# Patient Record
Sex: Male | Born: 1985 | Race: Black or African American | Hispanic: No | Marital: Single | Smoking: Current every day smoker
Health system: Southern US, Community
[De-identification: ages and names within clinical notes are randomized; demographics above are authoritative.]

---
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
## 2007-01-13 ENCOUNTER — Emergency Department (HOSPITAL_COMMUNITY): Admission: EM | Admit: 2007-01-13 | Discharge: 2007-01-13 | Payer: Self-pay | Admitting: Emergency Medicine

## 2007-10-30 ENCOUNTER — Emergency Department: Payer: Self-pay | Admitting: Unknown Physician Specialty
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
## 2017-07-22 ENCOUNTER — Encounter: Payer: Self-pay | Admitting: Emergency Medicine

## 2017-07-22 ENCOUNTER — Emergency Department
Admission: EM | Admit: 2017-07-22 | Discharge: 2017-07-22 | Disposition: A | Discharge status: Home / Self Care | Payer: Self-pay | Attending: Emergency Medicine | Admitting: Emergency Medicine

## 2017-07-22 DIAGNOSIS — Y939 Activity, unspecified: Secondary | ICD-10-CM | POA: Insufficient documentation

## 2017-07-22 DIAGNOSIS — F17210 Nicotine dependence, cigarettes, uncomplicated: Secondary | ICD-10-CM | POA: Insufficient documentation

## 2017-07-22 DIAGNOSIS — W57XXXA Bitten or stung by nonvenomous insect and other nonvenomous arthropods, initial encounter: Secondary | ICD-10-CM | POA: Insufficient documentation

## 2017-07-22 DIAGNOSIS — S0086XA Insect bite (nonvenomous) of other part of head, initial encounter: Secondary | ICD-10-CM | POA: Insufficient documentation

## 2017-07-22 DIAGNOSIS — R599 Enlarged lymph nodes, unspecified: Secondary | ICD-10-CM | POA: Insufficient documentation

## 2017-07-22 DIAGNOSIS — Y929 Unspecified place or not applicable: Secondary | ICD-10-CM | POA: Insufficient documentation

## 2017-07-22 DIAGNOSIS — R591 Generalized enlarged lymph nodes: Secondary | ICD-10-CM

## 2017-07-22 DIAGNOSIS — Y998 Other external cause status: Secondary | ICD-10-CM | POA: Insufficient documentation

## 2017-07-22 DIAGNOSIS — T63451A Toxic effect of venom of hornets, accidental (unintentional), initial encounter: Secondary | ICD-10-CM

## 2017-07-22 MED ORDER — METHYLPREDNISOLONE SODIUM SUCC 125 MG IJ SOLR
125.0000 mg | Freq: Once | INTRAMUSCULAR | Status: AC
  Administered 2017-07-22: 125 mg via INTRAMUSCULAR
  Filled 2017-07-22: qty 2

## 2017-07-22 MED ORDER — PREDNISONE 50 MG PO TABS: ORAL_TABLET | ORAL | 0 refills | Status: AC

## 2017-07-22 NOTE — ED Triage Notes (Signed)
Pt states he has a lump under his right ear, denies pain, states he isn't sure where it came from.

## 2017-07-22 NOTE — ED Notes (Signed)
See triage note.. States he was stung couple of days ago   States the sting was to right temporal area  Now has swelling under right ear   No resp distress

## 2017-07-22 NOTE — ED Provider Notes (Signed)
[facility] Emergency Department Provider Note  ____________________________________________  Time seen: Approximately 10:54 AM  I have reviewed the triage vital signs and the nursing notes.   HISTORY  Chief Complaint Insect Bite    HPI Alan Cardenas is a 31 y.o. male that presents to the emergency department for evaluation after a hornet sting 3 days ago. Patient noticed the next day that he has a nontender mass under his ear. He was stung by a hornet above his ear near his hairline. He states that area was initially red and painful but this has improved. He is being treated for epididymitis by the health department and is supposed to follow-up with them this week. No recent illness. He denies fever, chills, shortness of breath, chest pain, nausea, vomiting, abdominal pain.   History reviewed. No pertinent past medical history.  There are no active problems to display for this patient.   History reviewed. No pertinent surgical history.  Prior to Admission medications   Medication Sig Start Date End Date Taking? Authorizing Provider  predniSONE (DELTASONE) 50 MG tablet Take one tablet daily 07/22/17   Enid DerryWagner, Jace Fermin, PA-C    Allergies Patient has no known allergies.  No family history on file.  Social History Social History  Substance Use Topics  . Smoking status: Current Every Day Smoker    Packs/day: 1.00    Types: Cigarettes  . Smokeless tobacco: Never Used  . Alcohol use Yes     Comment: occas.     Review of Systems  Constitutional: No fever/chills Cardiovascular: No chest pain. Respiratory: No SOB. Gastrointestinal: No abdominal pain.  No nausea, no vomiting.  Musculoskeletal: Negative for musculoskeletal pain. Skin: Negative for abrasions, lacerations, ecchymosis. Neurological: Negative for headaches, numbness or tingling   ____________________________________________   PHYSICAL EXAM:  VITAL SIGNS: ED Triage Vitals  Enc  Vitals Group     BP 07/22/17 1019 133/69     Pulse Rate 07/22/17 1019 (!) 105     Resp 07/22/17 1019 20     Temp 07/22/17 1019 98.8 F (37.1 C)     Temp Source 07/22/17 1019 Oral     SpO2 07/22/17 1019 98 %     Weight 07/22/17 1014 180 lb (81.6 kg)     Height 07/22/17 1014 5\' 8"  (1.727 m)     Head Circumference --      Peak Flow --      Pain Score --      Pain Loc --      Pain Edu? --      Excl. in GC? --      Constitutional: Alert and oriented. Well appearing and in no acute distress. Eyes: Conjunctivae are normal. PERRL. EOMI. Head: Atraumatic. ENT:      Ears:      Nose: No congestion/rhinnorhea.      Mouth/Throat: Mucous membranes are moist.  Neck: No stridor. 1/2cm x 1/2 cm non tender mobile mass under right ear. Cardiovascular: Normal rate, regular rhythm.  Good peripheral circulation. Respiratory: Normal respiratory effort without tachypnea or retractions. Lungs CTAB. Good air entry to the bases with no decreased or absent breath sounds. Musculoskeletal: Full range of motion to all extremities. No gross deformities appreciated. Neurologic:  Normal speech and language. No gross focal neurologic deficits are appreciated.  Skin:  Skin is warm, dry and intact. No rash noted in hairline where hornet sting.    ____________________________________________   LABS (all labs ordered are listed, but only abnormal results are  displayed)  Labs Reviewed - No data to display ____________________________________________  EKG   ____________________________________________  RADIOLOGY  No results found.  ____________________________________________    PROCEDURES  Procedure(s) performed:    Procedures    Medications  methylPREDNISolone sodium succinate (SOLU-MEDROL) 125 mg/2 mL injection 125 mg (125 mg Intramuscular Given 07/22/17 1053)     ____________________________________________   INITIAL IMPRESSION / ASSESSMENT AND PLAN / ED COURSE  Pertinent labs &  imaging results that were available during my care of the patient were reviewed by me and considered in my medical decision making (see chart for details).  Review of the [place] CSRS was performed in accordance of the [facility] prior to dispensing any controlled drugs.    Patient presented to the emergency department for evaluation after hornet sting. Mass on his neck feels like a lymph node. He is also being treated at the health department for epididimitis. He was given a shot of Solu-Medrol. Patient will be discharged home with prescriptions for prednisone. He will up with his PCP this week if lymphadenopathy does not improve. Patient is given ED precautions to return to the ED for any worsening or new symptoms.     ____________________________________________  FINAL CLINICAL IMPRESSION(S) / ED DIAGNOSES  Final diagnoses:  Hornet sting, accidental or unintentional, initial encounter  Lymphadenopathy      NEW MEDICATIONS STARTED DURING THIS VISIT:  Discharge Medication List as of 07/22/2017 10:52 AM    START taking these medications   Details  predniSONE (DELTASONE) 50 MG tablet Take one tablet daily, Print            This chart was dictated using voice recognition software/Dragon. Despite best efforts to proofread, errors can occur which can change the meaning. Any change was purely unintentional.    Enid Derry, PA-C 07/22/17 1730    Jene Every, MD 07/25/17 [phone]

## 2018-01-22 ENCOUNTER — Other Ambulatory Visit: Payer: Self-pay

## 2018-01-22 ENCOUNTER — Emergency Department
Admission: EM | Admit: 2018-01-22 | Discharge: 2018-01-22 | Disposition: A | Discharge status: Home / Self Care | Payer: Self-pay | Attending: Emergency Medicine | Admitting: Emergency Medicine

## 2018-01-22 ENCOUNTER — Encounter: Payer: Self-pay | Admitting: Emergency Medicine

## 2018-01-22 DIAGNOSIS — H10023 Other mucopurulent conjunctivitis, bilateral: Secondary | ICD-10-CM | POA: Insufficient documentation

## 2018-01-22 DIAGNOSIS — F17210 Nicotine dependence, cigarettes, uncomplicated: Secondary | ICD-10-CM | POA: Insufficient documentation

## 2018-01-22 DIAGNOSIS — H1033 Unspecified acute conjunctivitis, bilateral: Secondary | ICD-10-CM

## 2018-01-22 MED ORDER — SULFACETAMIDE SODIUM 10 % OP SOLN: 2.0000 [drp] | Freq: Four times a day (QID) | OPHTHALMIC | 0 refills | Status: AC

## 2018-01-22 MED ORDER — CIPROFLOXACIN HCL 0.3 % OP SOLN
2.0000 [drp] | OPHTHALMIC | Status: DC
  Administered 2018-01-22: 2 [drp] via OPHTHALMIC
  Filled 2018-01-22: qty 2.5

## 2018-01-22 NOTE — ED Notes (Addendum)
Presents with both eyes red and irritated  States he developed this 2 days ago

## 2018-01-22 NOTE — ED Triage Notes (Signed)
Pt arrives ambulatory to triage with c/o of possible pink eye. Pt has redness noted to both eyes and drainage. Pt is in NAD.

## 2018-01-22 NOTE — ED Provider Notes (Signed)
[facility] Emergency Department Provider Note ____________________________________________  Time seen: Approximately 7:14 AM  I have reviewed the triage vital signs and the nursing notes.   HISTORY  Chief Complaint Conjunctivitis   HPI Alan Cardenas is a 32 y.o. male who presents to the emergency department for evaluation and treatment of redness to both eyes with drainage and matting. Eyes feel "gritty." No fever. No vision changes.  History reviewed. No pertinent past medical history.  There are no active problems to display for this patient.   History reviewed. No pertinent surgical history.  Prior to Admission medications   Medication Sig Start Date End Date Taking? Authorizing Provider  predniSONE (DELTASONE) 50 MG tablet Take one tablet daily 07/22/17   Enid Derry, PA-C  sulfacetamide (BLEPH-10) 10 % ophthalmic solution Place 2 drops into both eyes 4 (four) times daily for 10 days. 01/22/18 02/01/18  Chinita Pester, FNP    Allergies Patient has no known allergies.  No family history on file.  Social History Social History   Tobacco Use  . Smoking status: Current Every Day Smoker    Packs/day: 1.00    Types: Cigarettes  . Smokeless tobacco: Never Used  Substance Use Topics  . Alcohol use: Yes    Comment: occas.  . Drug use: No    Review of Systems   Constitutional: No fever/chills Eyes: negative for visual changes. Positive for pain. Musculoskeletal: Negative for pain. Skin: Negative for rash. Neurological: Negative for headaches, focal weakness or numbness. Allergic: Positive for seasonal allergies. ____________________________________________  PHYSICAL EXAM:  VITAL SIGNS: ED Triage Vitals  Enc Vitals Group     BP 01/22/18 0322 131/79     Pulse Rate 01/22/18 0322 98     Resp 01/22/18 0322 18     Temp 01/22/18 0322 98.5 F (36.9 C)     Temp Source 01/22/18 0322 Oral     SpO2 01/22/18 0322 98 %     Weight 01/22/18  0320 180 lb (81.6 kg)     Height 01/22/18 0320 5\' 8"  (1.727 m)     Head Circumference --      Peak Flow --      Pain Score 01/22/18 0320 4     Pain Loc --      Pain Edu? --      Excl. in GC? --     Constitutional: Alert and oriented. Well appearing and in no acute distress. Eyes: Visual acuity--see nursing documentation; no globe trauma; Eyelids normal to inspection; Sclera appears anicteric.  Eyelids not inverted. Conjunctiva appears erythematous to the limbus; Cornea clear on unstained exam. Head: Atraumatic. Nose: No congestion/rhinnorhea. Mouth/Throat: Mucous membranes are moist.  Oropharynx non-erythematous. Respiratory: Respirations even and unlabored.  Musculoskeletal:Normal ROM x 4 extremities. Neurologic:  Normal speech and language. No gross focal neurologic deficits are appreciated. Speech is normal. No gait instability. Skin:  Skin is warm, dry and intact. No rash noted. Psychiatric: Mood and affect are normal. Speech and behavior are normal.  ____________________________________________   LABS (all labs ordered are listed, but only abnormal results are displayed)  Labs Reviewed - No data to display ____________________________________________  EKG  Not indicated ____________________________________________  RADIOLOGY  Not indicated ____________________________________________   PROCEDURES  Procedure(s) performed: None ____________________________________________   INITIAL IMPRESSION / ASSESSMENT AND PLAN / ED COURSE  32 year old male presenting to the ER for evaluation and treatment of eye itching, irritation, and redness x 2 days. Exam and symptoms are consistent with conjunctivitis.  He was advised to follow up with ophthalmology if not improving over the next few days. He was advised to return to the ER for symptoms that change or worsen if unable to schedule an appointment.  Pertinent labs & imaging results that were available during my care of the  patient were reviewed by me and considered in my medical decision making (see chart for details). ____________________________________________   FINAL CLINICAL IMPRESSION(S) / ED DIAGNOSES  Final diagnoses:  Acute bacterial conjunctivitis of both eyes    Note:  This document was prepared using Dragon voice recognition software and may include unintentional dictation errors.    Chinita Pesterriplett, Delos Klich B, FNP 01/22/18 [id number]    Arnaldo NatalMalinda, Paul F, MD 01/22/18 [phone]

## 2018-07-13 LAB — HM HIV SCREENING LAB: HM HIV Screening: NEGATIVE

## 2020-06-26 ENCOUNTER — Ambulatory Visit: Payer: Self-pay | Admitting: Physician Assistant

## 2020-06-26 ENCOUNTER — Other Ambulatory Visit: Payer: Self-pay

## 2020-06-26 DIAGNOSIS — Z113 Encounter for screening for infections with a predominantly sexual mode of transmission: Secondary | ICD-10-CM

## 2020-06-26 LAB — GRAM STAIN

## 2020-06-26 NOTE — Progress Notes (Signed)
Gram Stain results reviewed. Per provider C. [place], [place] no treatment indicated.

## 2020-06-28 ENCOUNTER — Encounter: Payer: Self-pay | Admitting: Physician Assistant

## 2020-06-28 NOTE — Progress Notes (Signed)
   [facility] Department STI clinic/screening visit  Subjective:  Alan Cardenas is a 34 y.o. male being seen today for an STI screening visit. The patient reports they do have symptoms.    Patient has the following medical conditions:   Patient Active Problem List   Diagnosis Date Noted  . Asthma 09/27/2005     Chief Complaint  Patient presents with  . SEXUALLY TRANSMITTED DISEASE    screening    HPI  Patient reports that he has had "groin area pains" and discharge off and on in the mornings for about 2 weeks.  Denies other symptoms, surgeries and regular medicines.  Reports last HIV test was about 1 year ago and last void prior to sample collection for Gram stain was over 2 hr ago.   See flowsheet for further details and programmatic requirements.    The following portions of the patient's history were reviewed and updated as appropriate: allergies, current medications, past medical history, past social history, past surgical history and problem list.  Objective:  There were no vitals filed for this visit.  Physical Exam Constitutional:      General: He is not in acute distress.    Appearance: Normal appearance.  HENT:     Head: Normocephalic and atraumatic.     Comments: No nits, lice, or hair loss. No cervical, supraclavicular or axillary adenopathy.    Mouth/Throat:     Mouth: Mucous membranes are moist.     Pharynx: Oropharynx is clear. No oropharyngeal exudate or posterior oropharyngeal erythema.  Eyes:     Conjunctiva/sclera: Conjunctivae normal.  Pulmonary:     Effort: Pulmonary effort is normal.  Abdominal:     Palpations: Abdomen is soft. There is no mass.     Tenderness: There is no abdominal tenderness. There is no rebound.  Genitourinary:    Penis: Normal.      Testes: Normal.     Comments: Pubic area without nits, lice, edema, erythema, lesions and inguinal adenopathy. Penis circumcised, without rash, lesion and discharge from  meatus. Musculoskeletal:     Cervical back: Neck supple. No tenderness.  Skin:    General: Skin is warm and dry.     Findings: No bruising, erythema, lesion or rash.  Neurological:     Mental Status: He is alert and oriented to person, place, and time.  Psychiatric:        Mood and Affect: Mood normal.        Behavior: Behavior normal.        Thought Content: Thought content normal.        Judgment: Judgment normal.       Assessment and Plan:  Alan Cardenas is a 34 y.o. male presenting to the [facility] Department for STI screening  1. Screening for STD (sexually transmitted disease) Patient into clinic with symptoms. Reviewed with patient exam and Gram stain findings and offered to cover for NGU due to his symptoms. Patient declines treatment today and states that he will wait for results. Rec condoms with all sex. Await test results.  Counseled that RN will call if needs to RTC for treatment once results are back. - Gram stain - Gonococcus culture - HIV [place] [facility] - Syphilis Serology, [place] [facility]     No follow-ups on file.  No future appointments.  Matt Holmes, PA

## 2020-06-30 LAB — GONOCOCCUS CULTURE

## 2023-12-27 ENCOUNTER — Encounter: Payer: Self-pay | Admitting: Nurse Practitioner

## 2023-12-27 ENCOUNTER — Ambulatory Visit: Payer: Self-pay | Admitting: Nurse Practitioner

## 2023-12-27 DIAGNOSIS — Z113 Encounter for screening for infections with a predominantly sexual mode of transmission: Secondary | ICD-10-CM

## 2023-12-27 LAB — GRAM STAIN

## 2023-12-27 LAB — HM HIV SCREENING LAB: HM HIV Screening: NEGATIVE

## 2023-12-27 LAB — HM HEPATITIS C SCREENING LAB: HM Hepatitis Screen: NEGATIVE

## 2023-12-27 LAB — HEPATITIS B SURFACE ANTIGEN: Hepatitis B Surface Ag: NONREACTIVE
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
## 2023-12-29 NOTE — Progress Notes (Signed)
Pt is here for STD screening. Pt has been given metronidazole 2g Once, per provider results and condoms declined. Sonda Primes, RN.

## 2023-12-31 LAB — GONOCOCCUS CULTURE

## 2023-12-31 LAB — CHLAMYDIA/GC NAA, CONFIRMATION
Chlamydia trachomatis, NAA: NEGATIVE
Neisseria gonorrhoeae, NAA: NEGATIVE

## 2024-02-20 NOTE — Addendum Note (Signed)
 Addended by: Heywood Bene on: 02/20/2024 11:18 AM   Modules accepted: Orders
